# Patient Record
Sex: Male | Born: 1995 | Hispanic: No | Marital: Single | Smoking: Never smoker
Health system: Southern US, Community
[De-identification: ages and names within clinical notes are randomized; demographics above are authoritative.]

---

## 2017-02-06 ENCOUNTER — Ambulatory Visit
Admission: RE | Admit: 2017-02-06 | Discharge: 2017-02-06 | Disposition: A | Discharge status: Home / Self Care | Payer: No Typology Code available for payment source | Source: Ambulatory Visit | Attending: Family Medicine | Admitting: Family Medicine

## 2017-02-10 ENCOUNTER — Other Ambulatory Visit: Payer: Self-pay | Admitting: Family Medicine

## 2017-02-10 ENCOUNTER — Ambulatory Visit
Admission: RE | Admit: 2017-02-10 | Discharge: 2017-02-10 | Disposition: A | Discharge status: Home / Self Care | Payer: PRIVATE HEALTH INSURANCE | Source: Ambulatory Visit | Attending: Family Medicine | Admitting: Family Medicine

## 2017-02-10 DIAGNOSIS — X58XXXA Exposure to other specified factors, initial encounter: Secondary | ICD-10-CM | POA: Insufficient documentation

## 2017-02-10 DIAGNOSIS — M79644 Pain in right finger(s): Secondary | ICD-10-CM | POA: Insufficient documentation

## 2017-02-10 DIAGNOSIS — S62622A Displaced fracture of medial phalanx of right middle finger, initial encounter for closed fracture: Secondary | ICD-10-CM | POA: Diagnosis not present

## 2017-02-10 DIAGNOSIS — T1490XA Injury, unspecified, initial encounter: Secondary | ICD-10-CM | POA: Diagnosis not present

## 2017-02-12 ENCOUNTER — Other Ambulatory Visit: Payer: Self-pay | Admitting: Family Medicine

## 2017-02-12 ENCOUNTER — Ambulatory Visit
Admission: RE | Admit: 2017-02-12 | Discharge: 2017-02-12 | Disposition: A | Discharge status: Home / Self Care | Payer: PRIVATE HEALTH INSURANCE | Source: Ambulatory Visit | Attending: Family Medicine | Admitting: Family Medicine

## 2017-02-12 DIAGNOSIS — S62654D Nondisplaced fracture of medial phalanx of right ring finger, subsequent encounter for fracture with routine healing: Secondary | ICD-10-CM | POA: Diagnosis not present

## 2017-02-12 DIAGNOSIS — X58XXXD Exposure to other specified factors, subsequent encounter: Secondary | ICD-10-CM | POA: Insufficient documentation

## 2017-02-12 DIAGNOSIS — S6991XD Unspecified injury of right wrist, hand and finger(s), subsequent encounter: Secondary | ICD-10-CM

## 2017-02-12 DIAGNOSIS — S6990XA Unspecified injury of unspecified wrist, hand and finger(s), initial encounter: Secondary | ICD-10-CM | POA: Diagnosis present

## 2017-02-25 ENCOUNTER — Encounter: Payer: Self-pay | Admitting: Family Medicine

## 2017-02-25 ENCOUNTER — Ambulatory Visit (INDEPENDENT_AMBULATORY_CARE_PROVIDER_SITE_OTHER): Payer: No Typology Code available for payment source | Admitting: Family Medicine

## 2017-02-25 VITALS — BP 134/67 | HR 96 | Temp 100.7°F | Resp 16

## 2017-02-25 DIAGNOSIS — B349 Viral infection, unspecified: Secondary | ICD-10-CM

## 2017-02-25 DIAGNOSIS — R509 Fever, unspecified: Secondary | ICD-10-CM

## 2017-02-25 LAB — POCT INFLUENZA A/B
INFLUENZA B, POC: NEGATIVE
Influenza A, POC: NEGATIVE

## 2017-02-25 LAB — POCT RAPID STREP A (OFFICE): RAPID STREP A SCREEN: NEGATIVE

## 2017-02-25 MED ORDER — OSELTAMIVIR PHOSPHATE 75 MG PO CAPS: 75.0000 mg | ORAL_CAPSULE | Freq: Two times a day (BID) | ORAL | 0 refills | Status: AC

## 2017-02-25 NOTE — Progress Notes (Signed)
Patient presents today with symptoms of fever, myalgias, cough, nasal drainage, fatigue since last night. Patient denies any significant sore throat, severe headache, chest pain, shortness of breath, abdominal pain, nausea, vomiting, diarrhea, urinary symptoms, rash, neck stiffness, photophobia. Patient denies getting the flu vaccination this year. Patient has had the flu in the past and states that this is how he felt. Patient did take Ibuprofen prior to coming in today. Tylenol 1000mg s was given to patient in the office.  ROS: Negative except mentioned above. Vitals as per Epic. GENERAL: NAD HEENT: mild pharyngeal erythema, no exudate, no erythema of TMs, no cervical LAD RESP: CTA B CARD: RRR NEURO: CN II-XII grossly intact   A/P: Viral Illness - rapid strep negative, influenza screen negative, rest, hydration, discussed Tamiflu if patient would like to take it, it will be prescribed. Symptom relief with Tylenol/Ibuprofen, Delsym, Claritin when necessary. No class or athletic activity until afebrile for 24 hours without taking fever lowering medication. 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

## 2017-03-04 ENCOUNTER — Other Ambulatory Visit: Payer: Self-pay | Admitting: Family Medicine

## 2017-03-04 ENCOUNTER — Encounter: Payer: Self-pay | Admitting: Family Medicine

## 2017-03-04 ENCOUNTER — Ambulatory Visit (INDEPENDENT_AMBULATORY_CARE_PROVIDER_SITE_OTHER): Payer: No Typology Code available for payment source | Admitting: Family Medicine

## 2017-03-04 VITALS — BP 136/74 | HR 92 | Temp 98.8°F | Resp 14

## 2017-03-04 DIAGNOSIS — R509 Fever, unspecified: Secondary | ICD-10-CM

## 2017-03-04 DIAGNOSIS — R059 Cough, unspecified: Secondary | ICD-10-CM

## 2017-03-04 DIAGNOSIS — R05 Cough: Secondary | ICD-10-CM

## 2017-03-04 DIAGNOSIS — H6693 Otitis media, unspecified, bilateral: Secondary | ICD-10-CM

## 2017-03-04 MED ORDER — BENZONATATE 200 MG PO CAPS: 200.0000 mg | ORAL_CAPSULE | Freq: Two times a day (BID) | ORAL | 0 refills | Status: AC | PRN

## 2017-03-04 MED ORDER — AZITHROMYCIN 250 MG PO TABS: ORAL_TABLET | ORAL | 0 refills | Status: AC

## 2017-03-05 ENCOUNTER — Ambulatory Visit
Admission: RE | Admit: 2017-03-05 | Discharge: 2017-03-05 | Disposition: A | Discharge status: Home / Self Care | Payer: PRIVATE HEALTH INSURANCE | Source: Ambulatory Visit | Attending: Family Medicine | Admitting: Family Medicine

## 2017-03-05 DIAGNOSIS — J189 Pneumonia, unspecified organism: Secondary | ICD-10-CM | POA: Diagnosis not present

## 2017-03-05 DIAGNOSIS — R05 Cough: Secondary | ICD-10-CM | POA: Diagnosis not present

## 2017-03-05 DIAGNOSIS — R059 Cough, unspecified: Secondary | ICD-10-CM

## 2017-03-05 LAB — CBC WITH DIFFERENTIAL/PLATELET
BASOS: 0 %
Basophils Absolute: 0 10*3/uL (ref 0.0–0.2)
EOS (ABSOLUTE): 0.2 10*3/uL (ref 0.0–0.4)
EOS: 2 %
HEMATOCRIT: 41.2 % (ref 37.5–51.0)
HEMOGLOBIN: 14.2 g/dL (ref 13.0–17.7)
IMMATURE GRANS (ABS): 0.1 10*3/uL (ref 0.0–0.1)
IMMATURE GRANULOCYTES: 1 %
Lymphocytes Absolute: 1.2 10*3/uL (ref 0.7–3.1)
Lymphs: 13 %
MCH: 29.8 pg (ref 26.6–33.0)
MCHC: 34.5 g/dL (ref 31.5–35.7)
MCV: 87 fL (ref 79–97)
MONOS ABS: 0.9 10*3/uL (ref 0.1–0.9)
Monocytes: 10 %
NEUTROS PCT: 74 %
Neutrophils Absolute: 6.8 10*3/uL (ref 1.4–7.0)
Platelets: 246 10*3/uL (ref 150–379)
RBC: 4.76 x10E6/uL (ref 4.14–5.80)
RDW: 12.7 % (ref 12.3–15.4)
WBC: 9.2 10*3/uL (ref 3.4–10.8)

## 2017-03-05 LAB — BASIC METABOLIC PANEL
BUN/Creatinine Ratio: 15 (ref 9–20)
BUN: 15 mg/dL (ref 6–20)
CALCIUM: 9 mg/dL (ref 8.7–10.2)
CO2: 24 mmol/L (ref 20–29)
CREATININE: 0.99 mg/dL (ref 0.76–1.27)
Chloride: 103 mmol/L (ref 96–106)
GFR calc Af Amer: 127 mL/min/{1.73_m2} (ref >59)
GFR, EST NON AFRICAN AMERICAN: 110 mL/min/{1.73_m2} (ref >59)
Glucose: 89 mg/dL (ref 65–99)
Potassium: 4.1 mmol/L (ref 3.5–5.2)
Sodium: 142 mmol/L (ref 134–144)

## 2017-03-05 LAB — MONONUCLEOSIS SCREEN: Mono Screen: NEGATIVE

## 2017-03-05 NOTE — Progress Notes (Signed)
Patient presents today with persistent symptoms of cough and fever. Patient states that his fever has decreased since taking the Tamiflu that was prescribed to him last week. Patient has also felt some minimal pain in both the ears and feels like the ears need to pop. He denies any drainage from the ears. He states that he did try to play basketball because his fever was gone but was only able to play 5 minutes because he felt fatigued. He denies any chest pain, shortness of breath, wheezing, nausea, vomiting, diarrhea. He states that he has some neck soreness but no true photophobia. He has a mild headache of 4 out of 10. He has been taking the Delsym for his cough however it has not been helping much. His cough is worse at night. He denies any significant colored mucus that he is producing. He denies any significant weight loss or decreased appetite.  ROS: Negative except mentioned above. Vitals as per Epic. 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 Tylenol/Ibuprofen when necessary, can use Sudafed for nasal congestion/sinus pressure when necessary.  No athletic activity for now until afebrile for at least 48 hours without taking fever lowering medication. This was explained to the basketball staff in the office and the patient in the room. Seek medical attention if symptoms persist or worsen as discussed.

## 2017-08-05 ENCOUNTER — Ambulatory Visit (INDEPENDENT_AMBULATORY_CARE_PROVIDER_SITE_OTHER): Payer: No Typology Code available for payment source | Admitting: Family Medicine

## 2017-08-05 VITALS — BP 121/62 | HR 81 | Temp 98.5°F | Resp 14

## 2017-08-05 DIAGNOSIS — H9202 Otalgia, left ear: Secondary | ICD-10-CM

## 2017-08-05 DIAGNOSIS — J029 Acute pharyngitis, unspecified: Secondary | ICD-10-CM

## 2017-08-05 LAB — POCT RAPID STREP A (OFFICE): Rapid Strep A Screen: NEGATIVE

## 2017-08-05 MED ORDER — AMOXICILLIN 500 MG PO CAPS: 500.0000 mg | ORAL_CAPSULE | Freq: Two times a day (BID) | ORAL | 0 refills | Status: AC

## 2017-08-05 NOTE — Progress Notes (Signed)
Patient presents today with symptoms of sore throat, left-sided ear pain, nasal drainage, minimal cough. Patient states that he has had the symptoms for the last 2-3 days. He states that he often does get otitis media. He has some antibiotic drops that he has been using that he got from [place]. He denies any discharge from the ear. He has been using the drops for the last day. He denies any fever, chills, dizziness, sinus pressure, chest pain, shortness of breath, nausea, vomiting, diarrhea. He denies any seasonal allergies. He believes he was given ibuprofen or Tylenol earlier today by his athletic trainer.  ROS: Negative except mentioned above. Vitals as per Epic. GENERAL: NAD HEENT: mild pharyngeal erythema, no exudate, mild erythema of left TM compared to right, no discharge from the ears, no tenderness with movement of pinna, shotty cervical LAD RESP: CTA B CARD: RRR NEURO: CN II-XII grossly intact   A/P: Left Otitis Media, Pharyngitis - rapid strep test was negative in the office, throat culture sent to lab, will place patient on Amoxicillin given his history of otitis media, can stop using the drops, Claritin when necessary for nasal drainage, Ibuprofen/Tylenol when necessary for pain, avoid water in the ear, seek medical attention if symptoms persist or worsen as discussed.

## 2017-08-07 LAB — CULTURE, GROUP A STREP: STREP A CULTURE: NEGATIVE

## 2017-12-09 ENCOUNTER — Ambulatory Visit: Payer: PRIVATE HEALTH INSURANCE | Admitting: Family Medicine

## 2018-01-13 ENCOUNTER — Other Ambulatory Visit: Payer: Self-pay | Admitting: Family Medicine

## 2018-01-13 ENCOUNTER — Ambulatory Visit
Admission: RE | Admit: 2018-01-13 | Discharge: 2018-01-13 | Disposition: A | Discharge status: Home / Self Care | Payer: PRIVATE HEALTH INSURANCE | Source: Ambulatory Visit | Attending: Family Medicine | Admitting: Family Medicine

## 2018-01-13 DIAGNOSIS — M79645 Pain in left finger(s): Secondary | ICD-10-CM | POA: Diagnosis present

## 2018-01-13 DIAGNOSIS — R52 Pain, unspecified: Secondary | ICD-10-CM

## 2018-05-03 ENCOUNTER — Ambulatory Visit (INDEPENDENT_AMBULATORY_CARE_PROVIDER_SITE_OTHER): Payer: PRIVATE HEALTH INSURANCE | Admitting: Family Medicine

## 2018-05-03 VITALS — Temp 98.3°F | Resp 14

## 2018-05-03 DIAGNOSIS — T148XXA Other injury of unspecified body region, initial encounter: Secondary | ICD-10-CM

## 2018-05-03 DIAGNOSIS — R21 Rash and other nonspecific skin eruption: Secondary | ICD-10-CM | POA: Diagnosis not present

## 2018-05-04 LAB — SPECIMEN STATUS REPORT

## 2018-05-05 LAB — CBC WITH DIFFERENTIAL/PLATELET
BASOS ABS: 0 10*3/uL (ref 0.0–0.2)
Basos: 1 %
EOS (ABSOLUTE): 0 10*3/uL (ref 0.0–0.4)
Eos: 1 %
HEMOGLOBIN: 14.4 g/dL (ref 13.0–17.7)
Hematocrit: 43.5 % (ref 37.5–51.0)
Immature Grans (Abs): 0 10*3/uL (ref 0.0–0.1)
Immature Granulocytes: 0 %
LYMPHS ABS: 1.4 10*3/uL (ref 0.7–3.1)
Lymphs: 24 %
MCH: 29.2 pg (ref 26.6–33.0)
MCHC: 33.1 g/dL (ref 31.5–35.7)
MCV: 88 fL (ref 79–97)
Monocytes Absolute: 0.5 10*3/uL (ref 0.1–0.9)
Monocytes: 8 %
Neutrophils Absolute: 4 10*3/uL (ref 1.4–7.0)
Neutrophils: 66 %
PLATELETS: 199 10*3/uL (ref 150–450)
RBC: 4.93 x10E6/uL (ref 4.14–5.80)
RDW: 12.4 % (ref 11.6–15.4)
WBC: 6 10*3/uL (ref 3.4–10.8)

## 2018-05-05 LAB — PT AND PTT

## 2018-05-05 NOTE — Progress Notes (Signed)
Patient presents today with symptoms of a bruised type rash noted on upper arms posterior neck and torso area.  Patient states he has had the one on his abdomen for a few years.  The others have developed over the last few months.  He states that it is minimally itchy.  He denies any discharge from the sites.  He has not taken any medications for his symptoms.  He is not on any chronic medications.  He denies any chronic illnesses.  He denies any bleeding from nose, gums, any other orifice.  Initially he thought they were bruises from basketball but they are in areas that he normally does not get hit.  ROS: Negative except mentioned above. Vitals as per Epic.  GENERAL: NAD HEENT: no pharyngeal erythema, no exudate, no significant cervical LAD  RESP: CTA B CARD: RRR SKIN: multiple quarter to dime sized ecchymotic circular non-raised lesions noted primarily on upper extremities, there is one on the posterior neck area on the left and also one on the left lower chest wall area, no discharge from the sites, no streaks NEURO: CN II-XII grossly intact   A/P: Skin Lesions -the consistency of the lesions do not appear to be contusions, unsure if there is a systemic source, will do CBC, PT/PTT initially, will also send patient to Dermatology for an evaluation, if any symptoms change or worsen patient is to seek medical attention, I do not feel that this rash is contagious so will not limit activity for now, discussed above with patient and AT.

## 2018-08-24 ENCOUNTER — Other Ambulatory Visit: Payer: Self-pay | Admitting: *Deleted

## 2018-08-24 DIAGNOSIS — Z20822 Contact with and (suspected) exposure to covid-19: Secondary | ICD-10-CM

## 2018-08-24 NOTE — Progress Notes (Signed)
Order placed for COVID-19 testing.  

## 2018-08-25 ENCOUNTER — Other Ambulatory Visit: Payer: PRIVATE HEALTH INSURANCE

## 2018-08-25 DIAGNOSIS — Z20822 Contact with and (suspected) exposure to covid-19: Secondary | ICD-10-CM

## 2018-08-29 LAB — NOVEL CORONAVIRUS, NAA: SARS-CoV-2, NAA: NOT DETECTED

## 2018-09-30 ENCOUNTER — Other Ambulatory Visit: Payer: Self-pay | Admitting: *Deleted

## 2018-09-30 DIAGNOSIS — Z20822 Contact with and (suspected) exposure to covid-19: Secondary | ICD-10-CM

## 2018-10-03 ENCOUNTER — Other Ambulatory Visit: Payer: Self-pay

## 2018-10-03 DIAGNOSIS — Z20822 Contact with and (suspected) exposure to covid-19: Secondary | ICD-10-CM

## 2018-10-08 LAB — NOVEL CORONAVIRUS, NAA: SARS-CoV-2, NAA: NOT DETECTED

## 2018-10-14 ENCOUNTER — Other Ambulatory Visit: Payer: Self-pay | Admitting: Family Medicine

## 2018-10-14 DIAGNOSIS — Z20822 Contact with and (suspected) exposure to covid-19: Secondary | ICD-10-CM

## 2018-10-14 DIAGNOSIS — Z20828 Contact with and (suspected) exposure to other viral communicable diseases: Secondary | ICD-10-CM

## 2018-10-19 LAB — NOVEL CORONAVIRUS, NAA: SARS-CoV-2, NAA: NOT DETECTED

## 2018-12-08 ENCOUNTER — Telehealth (INDEPENDENT_AMBULATORY_CARE_PROVIDER_SITE_OTHER): Payer: PRIVATE HEALTH INSURANCE | Admitting: Family Medicine

## 2018-12-08 ENCOUNTER — Other Ambulatory Visit: Payer: Self-pay

## 2018-12-08 DIAGNOSIS — H02845 Edema of left lower eyelid: Secondary | ICD-10-CM

## 2018-12-08 MED ORDER — POLYMYXIN B-TRIMETHOPRIM 10000-0.1 UNIT/ML-% OP SOLN: 1.0000 [drp] | Freq: Four times a day (QID) | OPHTHALMIC | 0 refills | Status: AC

## 2018-12-11 NOTE — Progress Notes (Signed)
Virtual Visit Agreed By Patient Patient states that he has had one day of swelling of his left upper eyelid. Minimal discomfort. Denies any trauma to the eye or any vision problems. Denies fever, any redness of the eye or discharge from the eye. Denies any other symptoms. Has been using warm compresses on the area and has gotten slightly smaller.   ROS: Negative except mentioned above.  GENERAL: NAD HEENT: focal area of swelling of left upper eyelid noted on virtual call, appears to look like a hordelum, no erythema of conjunctiva or around orbit  NEURO: CN II-XII grossly intact   A/P: Left Upper Eyelid Swelling - appears to look like a hordelum, will prescribe Polytrim for a few days and encouraged warm compresses a few times a day, Ibuprofen prn, if symptoms persist or worsen will follow-up in person and/or will send patient to Optho. Patient addresses understanding.

## 2019-05-10 IMAGING — CR DG FINGER RING 2+V*R*
1 series · 3 of 3 positions shown · non-contrast
Comparison: 02/06/2017

CLINICAL DATA: Injury to the fourth digit

EXAM:
RIGHT RING FINGER 2+V

[Series 1: dg finger ring right · 0.14mm/px · 3 of 3 slices shown]
[im 1/3]
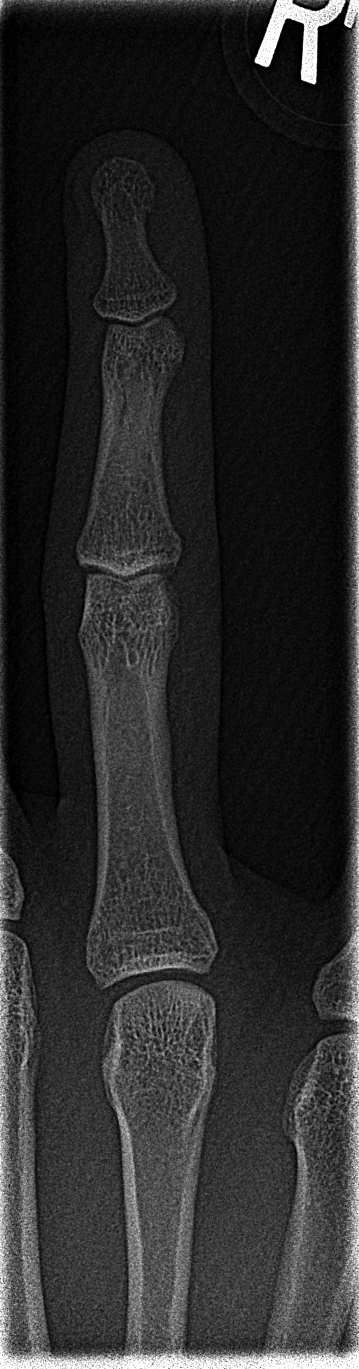
[im 2/3]
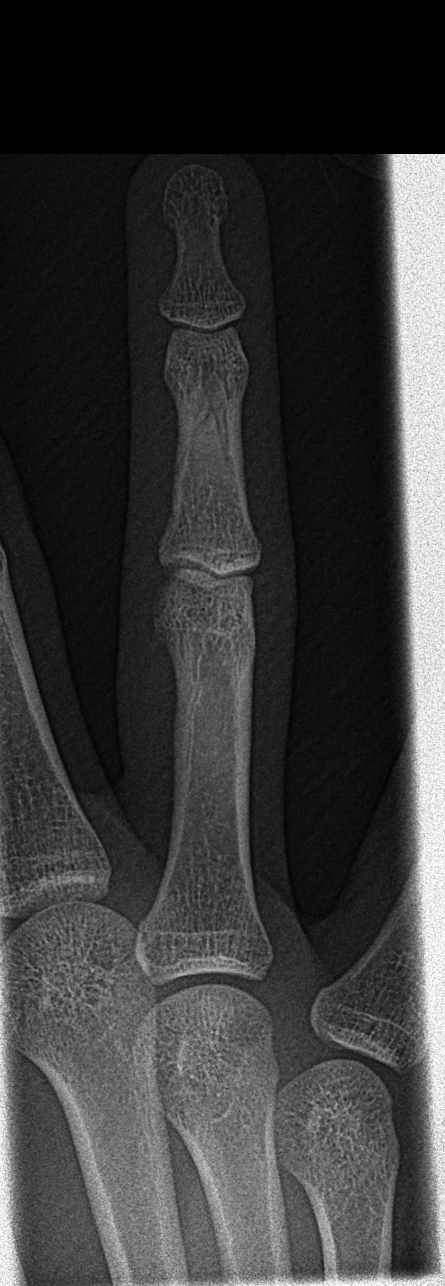
[im 3/3]
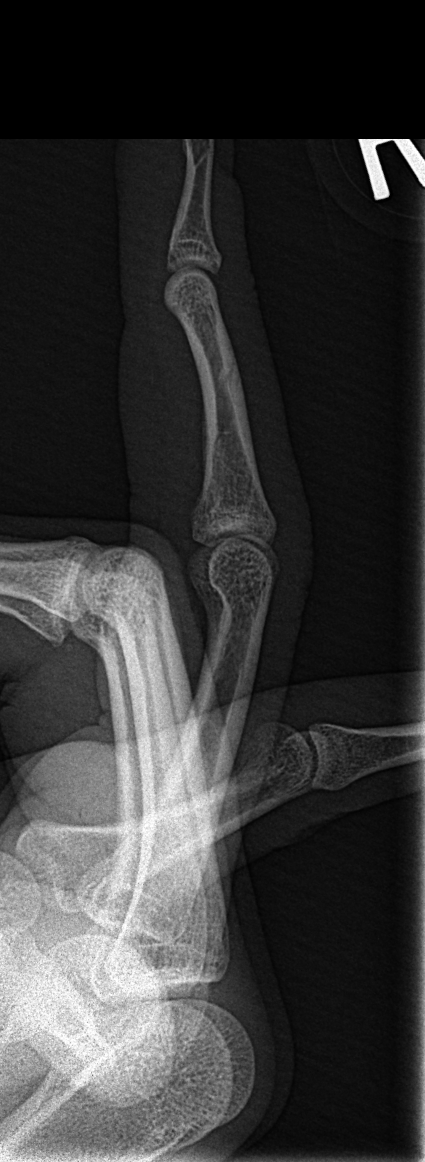

[3 of 3 positions shown; findings below may reference images not displayed]

FINDINGS: Irregular linear lucencies in the shaft of the fourth middle phalanx
consistent with healing fracture. No significant displacement. No
angulation. No subluxation
IMPRESSION: Healing nondisplaced fracture involving the mid to distal shaft of
the fourth middle phalanx. Lucencies are less distinct by remain
visible.
